# Patient Record
Sex: Male | Born: 1990 | Race: Black or African American | Hispanic: No | Marital: Single | Smoking: Never smoker
Health system: Southern US, Community
[De-identification: ages and names within clinical notes are randomized; demographics above are authoritative.]

---

## 2013-07-17 ENCOUNTER — Ambulatory Visit (INDEPENDENT_AMBULATORY_CARE_PROVIDER_SITE_OTHER): Payer: 59 | Admitting: Medical

## 2013-07-17 VITALS — BP 130/90 | HR 60 | Temp 98.1°F | Resp 18 | Wt 206.0 lb

## 2013-07-17 DIAGNOSIS — K047 Periapical abscess without sinus: Secondary | ICD-10-CM

## 2013-07-17 DIAGNOSIS — Z23 Encounter for immunization: Secondary | ICD-10-CM

## 2013-07-17 MED ORDER — HYDROCODONE-ACETAMINOPHEN 5-325 MG PO TABS: 1.0000 | ORAL_TABLET | Freq: Four times a day (QID) | ORAL | Status: DC | PRN

## 2013-07-17 MED ORDER — AMOXICILLIN 875 MG PO TABS: 875.0000 mg | ORAL_TABLET | Freq: Two times a day (BID) | ORAL | Status: DC

## 2013-07-17 NOTE — Addendum Note (Signed)
Addended by: Janeice Robinson on: 07/17/2013 09:07 AM   Modules accepted: Orders

## 2013-07-17 NOTE — Patient Instructions (Signed)
Begin Amoxicillin antibiotic, twice daily for 10 days, with food.  Take the whole course of antibiotic.  Use either OTC pain medication, or if worse pain, you can use the Hydrocodone pain tablet, 1 tablet every 6 hours.  This may cause drowsiness.    Dr. Yancey Flemings, dentist [address], [place], [place] [address] [phone] [email or web address]  Dental Abscess A dental abscess is a collection of infected fluid (pus) from a bacterial infection in the inner part of the tooth (pulp). It usually occurs at the end of the tooth's root.  CAUSES   Severe tooth decay.  Trauma to the tooth that allows bacteria to enter into the pulp, such as a broken or chipped tooth. SYMPTOMS   Severe pain in and around the infected tooth.  Swelling and redness around the abscessed tooth or in the mouth or face.  Tenderness.  Pus drainage.  Bad breath.  Bitter taste in the mouth.  Difficulty swallowing.  Difficulty opening the mouth.  Nausea.  Vomiting.  Chills.  Swollen neck glands. DIAGNOSIS   A medical and dental history will be taken.  An examination will be performed by tapping on the abscessed tooth.  X-rays may be taken of the tooth to identify the abscess. TREATMENT The goal of treatment is to eliminate the infection. You may be prescribed antibiotic medicine to stop the infection from spreading. A root canal may be performed to save the tooth. If the tooth cannot be saved, it may be pulled (extracted) and the abscess may be drained.  HOME CARE INSTRUCTIONS  Only take over-the-counter or prescription medicines for pain, fever, or discomfort as directed by your caregiver.  Rinse your mouth (gargle) often with salt water ( tsp salt in 8 oz [250 ml] of warm water) to relieve pain or swelling.  Do not drive after taking pain medicine (narcotics).  Do not apply heat to the outside of your face.  Return to your dentist for further treatment as directed. SEEK MEDICAL CARE  IF:  Your pain is not helped by medicine.  Your pain is getting worse instead of better. SEEK IMMEDIATE MEDICAL CARE IF:  You have a fever or persistent symptoms for more than 2 3 days.  You have a fever and your symptoms suddenly get worse.  You have chills or a very bad headache.  You have problems breathing or swallowing.  You have trouble opening your mouth.  You have swelling in the neck or around the eye. Document Released: 10/08/2005 Document Revised: 07/02/2012 Document Reviewed: 01/16/2011 [facility] Patient Information 2014 [place], [place].

## 2013-07-17 NOTE — Progress Notes (Signed)
Subjective:  Bobby Ramsey is a 22 y.o. male who presents as a new patient for abscessed tooth.  Was in usual state of health until last week.  Been feeling bad since a week ago.  Been having pain in mouth, swelling of the tooth area and face adjacent.  It popped last night and drained some pus.  Has felt a little feverish.  Does not have a dentist.  No other aggravating or relieving factors.  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  No prior medical problems.   No other c/o.   The following portions of the patient's history were reviewed and updated as appropriate: allergies, current medications, past family history, past medical history, past social history, past surgical history and problem list.  ROS Otherwise as in subjective above  Objective: Physical Exam  BP 130/90  Pulse 60  Temp(Src) 98.1 F (36.7 C) (Oral)  Resp 18  Wt 206 lb (93.441 kg)  General appearance: alert, no distress, WD/WN Mild swelling of left maxilla, otherwise no swelling Oral cavity: MMM, left 2nd upper molar with obvious decay, moderate plaque of teeth in general, otherwise no lesions, no drainage currently Neck: supple, no lymphadenopathy   Assessment: Encounter Diagnoses  Name Primary?  . Dental abscess Yes  . Need for prophylactic vaccination and inoculation against influenza      Plan: Dental abscess - begin amoxicillin, pain medication prn or OTC pain medication prn, f/u with dentist within a week.  Counseled on the influenza virus vaccine.  Vaccine information sheet given.  Influenza vaccine given after consent obtained.  Follow up: prn

## 2014-05-16 ENCOUNTER — Emergency Department (HOSPITAL_COMMUNITY)
Admission: EM | Admit: 2014-05-16 | Discharge: 2014-05-16 | Disposition: A | Discharge status: Home / Self Care | Payer: Self-pay | Attending: Emergency Medicine | Admitting: Emergency Medicine

## 2014-05-16 ENCOUNTER — Encounter (HOSPITAL_COMMUNITY): Payer: Self-pay | Admitting: Emergency Medicine

## 2014-05-16 DIAGNOSIS — K6289 Other specified diseases of anus and rectum: Secondary | ICD-10-CM | POA: Insufficient documentation

## 2014-05-16 DIAGNOSIS — K612 Anorectal abscess: Secondary | ICD-10-CM | POA: Insufficient documentation

## 2014-05-16 DIAGNOSIS — K645 Perianal venous thrombosis: Secondary | ICD-10-CM | POA: Insufficient documentation

## 2014-05-16 NOTE — ED Provider Notes (Signed)
CSN: [id number]     Arrival date & time 05/16/14  1035 History   First MD Initiated Contact with Patient 05/16/14 1109     Chief Complaint  Patient presents with  . Rectal Pain  . Abscess     (Consider location/radiation/quality/duration/timing/severity/associated sxs/prior Treatment) HPI  Bobby Ramsey is a 23 y.o. male who is otherwise healthy presenting with rectal pain worsening over the course of 3 days. No prior similar episodes. Patient denies fever, blood in stool, discharge from the affected area. States there is a bump that is painful externally.  History reviewed. No pertinent past medical history. History reviewed. No pertinent past surgical history. History reviewed. No pertinent family history. History  Substance Use Topics  . Smoking status: Never Smoker   . Smokeless tobacco: Not on file  . Alcohol Use: No    Review of Systems  10 systems reviewed and found to be negative, except as noted in the HPI.   Allergies  Review of patient's allergies indicates no known allergies.  Home Medications   Prior to Admission medications   Not on File   BP 112/54  Pulse 56  Temp(Src) 98.1 F (36.7 C) (Oral)  Resp 20  SpO2 100% Physical Exam  Nursing note and vitals reviewed. Constitutional: He is oriented to person, place, and time. He appears well-developed and well-nourished. No distress.  HENT:  Head: Normocephalic.  Eyes: Conjunctivae and EOM are normal.  Cardiovascular: Normal rate, regular rhythm and intact distal pulses.   Pulmonary/Chest: Effort normal and breath sounds normal. No stridor.  Abdominal: Soft. Bowel sounds are normal.  Genitourinary:  Thrombosed external hemorrhoid at 6 7:00 position, approximately 2 cm in diameter. They're tender to palpation and firm, dusky color.    digital rectal exam with no internal pain, no internal hemorrhoids appreciated  Musculoskeletal: Normal range of motion.  Neurological: He is alert and oriented to  person, place, and time.  Psychiatric: He has a normal mood and affect.    ED Course  Procedures (including critical care time)  INCISION AND DRAINAGE Performed by: Wynetta Emery Consent: Verbal consent obtained. Risks and benefits: risks, benefits and alternatives were discussed Type: abscess  Body area: Rectal  Anesthesia: local infiltration  Incision was made with a scalpel.  Local anesthetic: lidocaine 2% with epinephrine  Anesthetic total: 2 ml  Complexity: complex Blunt dissection to break up loculations  Drainage: Multiple clots   Drainage amount: Moderate   Packing material: None   Patient tolerance: Patient tolerated the procedure well with no immediate complications.    Labs Review Labs Reviewed - No data to display   Imaging Review No results found.    EKG Interpretation None      MDM   Final diagnoses:  Thrombosed external hemorrhoid    Filed Vitals:   05/16/14 1045 05/16/14 1244  BP: 114/60 112/54  Pulse: 64 56  Temp: 98.8 F (37.1 C) 98.1 F (36.7 C)  TempSrc: Oral Oral  Resp: 16 20  SpO2: 99% 100%    Medications - No data to display  Bobby Ramsey is a 23 y.o. male presenting with painful thrombosed external hemorrhoid. Clots excised, with good relief of pain. Instructed patient on sitz baths and return precautions  Evaluation does not show pathology that would require ongoing emergent intervention or inpatient treatment. Pt is hemodynamically stable and mentating appropriately. Discussed findings and plan with patient/guardian, who agrees with care plan. All questions answered. Return precautions discussed and outpatient follow up given.  Wynetta Emeryicole Joren Rehm, PA-C 05/16/14 2013

## 2014-05-16 NOTE — ED Notes (Signed)
Pt reports he started having rectal pain on Friday night. Reports there is a bump near his anus. 6/10. Pt denies blood in stool.

## 2014-05-16 NOTE — Discharge Instructions (Signed)
Perform sitz bath at least 4 times a day and after every bowel movement.  Please follow with your primary care doctor in the next 2 days for a check-up. They must obtain records for further management.   Do not hesitate to return to the Emergency Department for any new, worsening or concerning symptoms.

## 2014-05-17 NOTE — ED Provider Notes (Signed)
Medical screening examination/treatment/procedure(s) were performed by non-physician practitioner and as supervising physician I was immediately available for consultation/collaboration.   EKG Interpretation None       Bobby MelterElliott L Antara Brecheisen, MD 05/17/14 1158

## 2014-09-20 ENCOUNTER — Ambulatory Visit (HOSPITAL_COMMUNITY): Payer: Self-pay | Attending: Emergency Medicine

## 2014-09-20 ENCOUNTER — Encounter (HOSPITAL_COMMUNITY): Payer: Self-pay | Admitting: *Deleted

## 2014-09-20 ENCOUNTER — Emergency Department (HOSPITAL_COMMUNITY)
Admission: EM | Admit: 2014-09-20 | Discharge: 2014-09-20 | Disposition: A | Discharge status: Home / Self Care | Payer: Self-pay | Source: Home / Self Care | Attending: Emergency Medicine | Admitting: Emergency Medicine

## 2014-09-20 DIAGNOSIS — J111 Influenza due to unidentified influenza virus with other respiratory manifestations: Secondary | ICD-10-CM

## 2014-09-20 DIAGNOSIS — R05 Cough: Secondary | ICD-10-CM | POA: Insufficient documentation

## 2014-09-20 DIAGNOSIS — R69 Illness, unspecified: Principal | ICD-10-CM

## 2014-09-20 DIAGNOSIS — R059 Cough, unspecified: Secondary | ICD-10-CM

## 2014-09-20 LAB — POCT RAPID STREP A: STREPTOCOCCUS, GROUP A SCREEN (DIRECT): NEGATIVE

## 2014-09-20 MED ORDER — GUAIFENESIN-CODEINE 100-10 MG/5ML PO SYRP: 5.0000 mL | ORAL_SOLUTION | Freq: Three times a day (TID) | ORAL | Status: AC | PRN

## 2014-09-20 MED ORDER — TRAMADOL HCL 50 MG PO TABS: 100.0000 mg | ORAL_TABLET | Freq: Three times a day (TID) | ORAL | Status: AC | PRN

## 2014-09-20 MED ORDER — NAPROXEN 500 MG PO TABS: 500.0000 mg | ORAL_TABLET | Freq: Two times a day (BID) | ORAL | Status: AC

## 2014-09-20 NOTE — ED Notes (Signed)
2 days of sore throat, productive cough, fever--up to 102--, sinus/nasal congestion and weakness

## 2014-09-20 NOTE — ED Provider Notes (Signed)
Chief Complaint   Cough   History of Present Illness   Bobby NeighborsWarren Poch is a 23 year old male who has had a three-day history of cough productive of green sputum, chest pain, headache, weakness, fatigue, fever to 102, chills, sweats, sore throat, nasal congestion, rhinorrhea, and ear pressure. He denies any GI symptoms. Has had no sick exposures.  Review of Systems   Other than as noted above, the patient denies any of the following symptoms: Systemic:  No fevers, chills, sweats, or myalgias. Eye:  No redness or discharge. ENT:  No ear pain, headache, nasal congestion, drainage, sinus pressure, or sore throat. Neck:  No neck pain, stiffness, or swollen glands. Lungs:  No cough, sputum production, hemoptysis, wheezing, chest tightness, shortness of breath or chest pain. GI:  No abdominal pain, nausea, vomiting or diarrhea.  PMFSH   Past medical history, family history, social history, meds, and allergies were reviewed.   Physical exam   Vital signs:  BP 123/80 mmHg  Pulse 90  Temp(Src) 99.6 F (37.6 C) (Oral)  Resp 16  SpO2 97% General:  Alert and oriented.  In no distress.  Skin warm and dry. Eye:  No conjunctival injection or drainage. Lids were normal. ENT:  TMs and canals were normal, without erythema or inflammation.  Nasal mucosa was clear and uncongested, without drainage.  Mucous membranes were moist.  Pharynx was clear with no exudate or drainage.  There were no oral ulcerations or lesions. Neck:  Supple, no adenopathy, tenderness or mass. Lungs:  No respiratory distress.  Lungs were clear to auscultation, without wheezes, rales or rhonchi.  Breath sounds were clear and equal bilaterally.  Heart:  Regular rhythm, without gallops, murmers or rubs. Skin:  Clear, warm, and dry, without rash or lesions.  Labs   Results for orders placed or performed during the hospital encounter of 09/20/14  POCT rapid strep A [facility])  Result Value Ref Range   Streptococcus,  Group A Screen (Direct) NEGATIVE NEGATIVE     Radiology   Dg Chest 2 View  09/20/2014   CLINICAL DATA:  Cough  EXAM: CHEST  2 VIEW  COMPARISON:  None.  FINDINGS: The heart size and mediastinal contours are within normal limits. Both lungs are clear. The visualized skeletal structures are unremarkable.  IMPRESSION: No active cardiopulmonary disease.   Electronically Signed   By: Marlan Palauharles  Clark M.D.   On: 09/20/2014 13:00   Assessment     The primary encounter diagnosis was Influenza-like illness. A diagnosis of Cough was also pertinent to this visit.  There is no evidence of pneumonia, strep throat, sinusitis, otitis media.    Plan    1.  Meds:  The following meds were prescribed:   Discharge Medication List as of 09/20/2014  1:32 PM    START taking these medications   Details  guaiFENesin-codeine (ROBITUSSIN AC) 100-10 MG/5ML syrup Take 5 mLs by mouth 3 (three) times daily as needed for cough., Starting 09/20/2014, Until Discontinued, Print    naproxen (NAPROSYN) 500 MG tablet Take 1 tablet (500 mg total) by mouth 2 (two) times daily., Starting 09/20/2014, Until Discontinued, Normal    traMADol (ULTRAM) 50 MG tablet Take 2 tablets (100 mg total) by mouth every 8 (eight) hours as needed., Starting 09/20/2014, Until Discontinued, Print        2.  Patient Education/Counseling:  The patient was given appropriate handouts, self care instructions, and instructed in symptomatic relief.  Instructed to get extra fluids and extra rest.  3.  Follow up:  The patient was told to follow up here if no better in 3 to 4 days, or sooner if becoming worse in any way, and given some red flag symptoms such as increasing fever, difficulty breathing, chest pain, or persistent vomiting which would prompt immediate return.       Reuben Likesavid C Dawnell Bryant, MD 09/20/14 [phone]

## 2014-09-20 NOTE — Discharge Instructions (Signed)

## 2014-09-22 LAB — CULTURE, GROUP A STREP

## 2015-12-19 IMAGING — DX DG CHEST 2V
2 series · 2 of 2 positions shown · non-contrast
Comparison: None.

CLINICAL DATA: Cough

EXAM:
CHEST  2 VIEW

[chest lat]
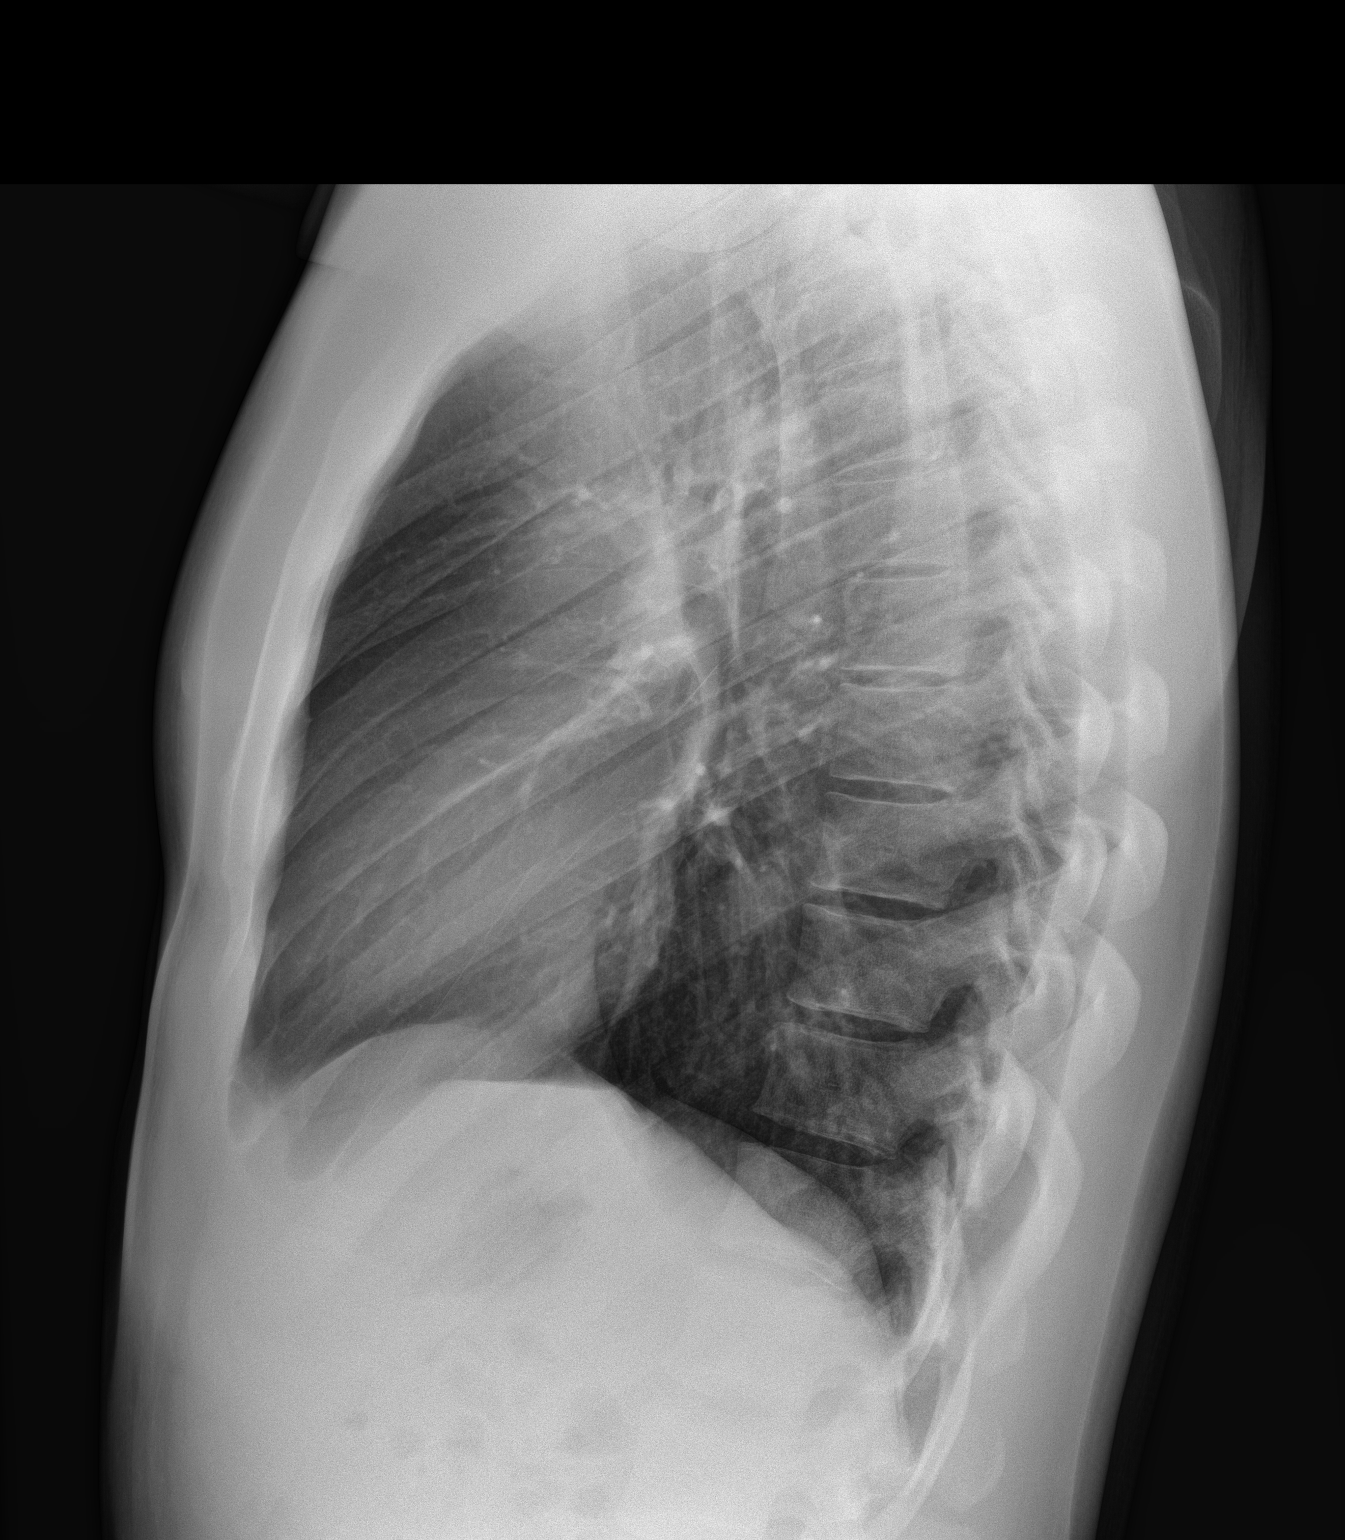

[chest pa]
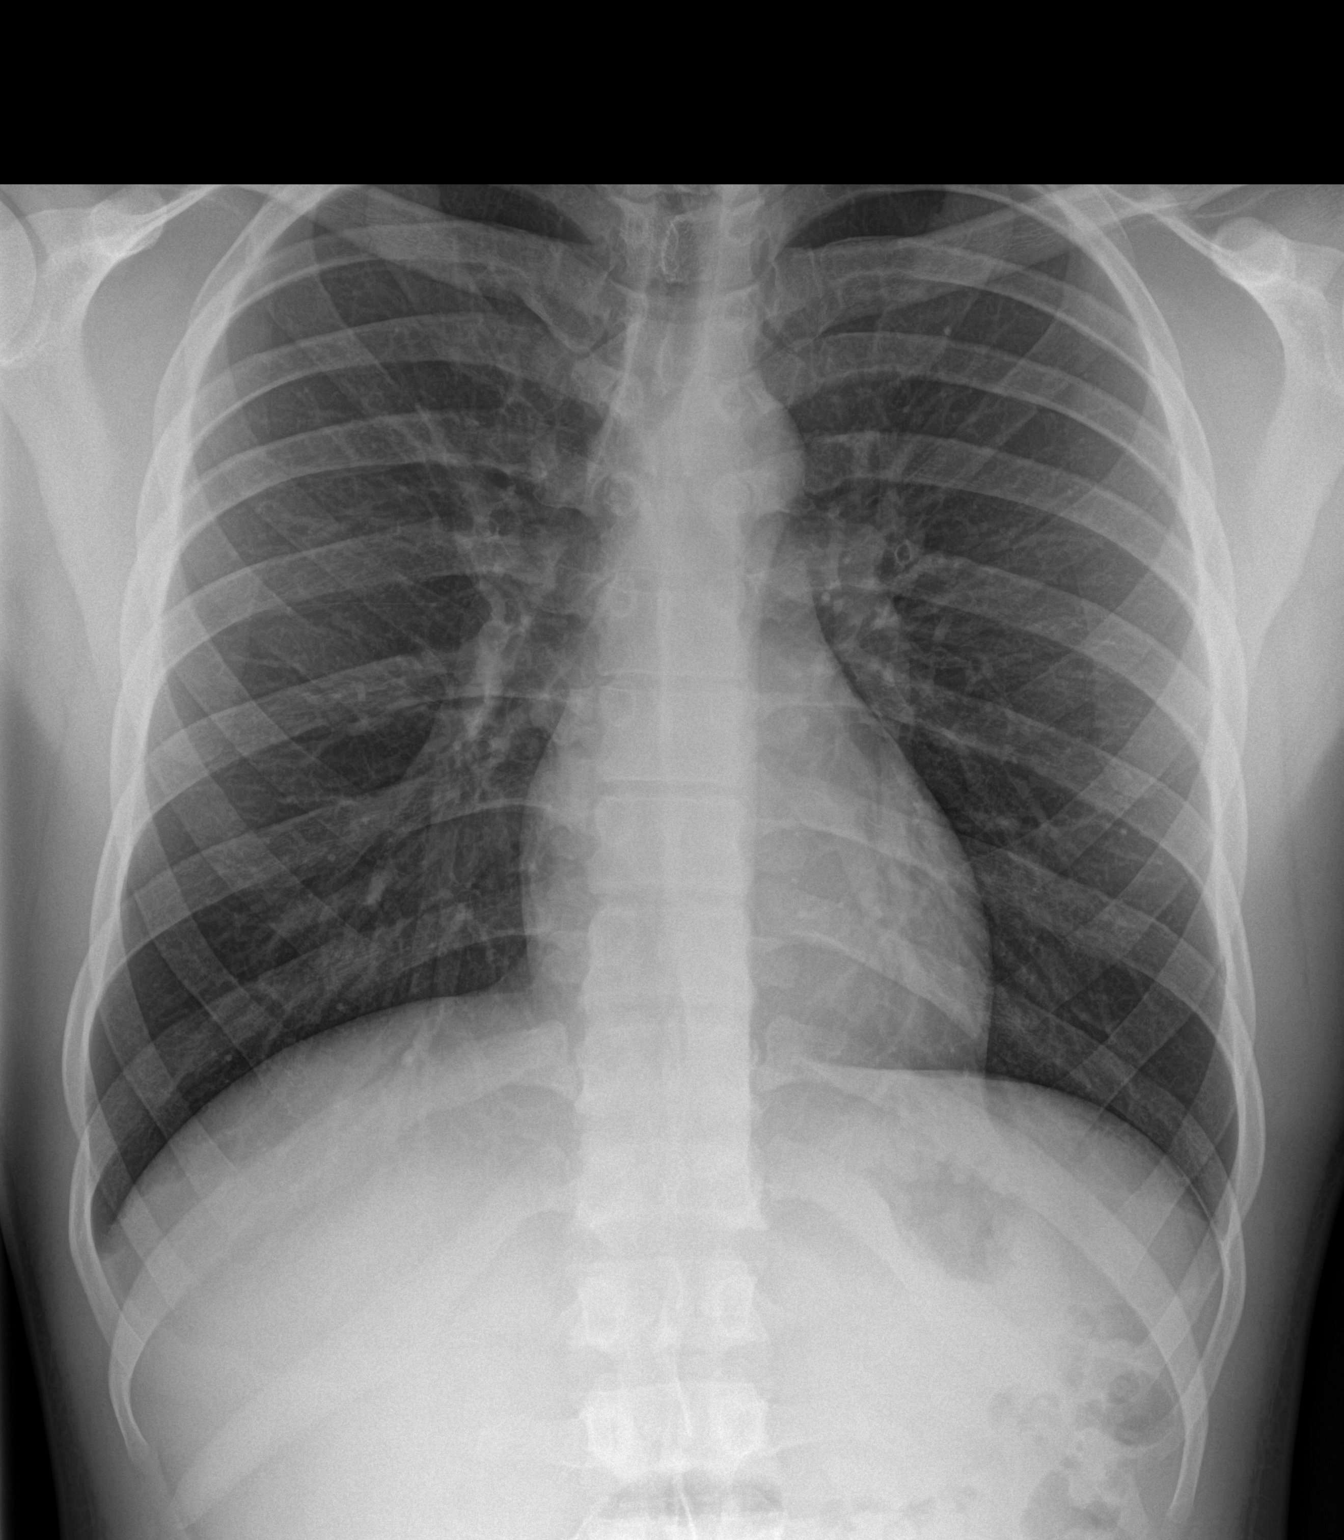

[2 of 2 positions shown; findings below may reference images not displayed]

FINDINGS: The heart size and mediastinal contours are within normal limits.
Both lungs are clear. The visualized skeletal structures are
unremarkable.
IMPRESSION: No active cardiopulmonary disease.
